# Patient Record
Sex: Male | Born: 1985 | Race: White | Hispanic: No | Marital: Single | Smoking: Never smoker
Health system: Southern US, Community
[De-identification: ages and names within clinical notes are randomized; demographics above are authoritative.]

## PROBLEM LIST (undated history)

## (undated) DIAGNOSIS — R109 Unspecified abdominal pain: Secondary | ICD-10-CM

## (undated) DIAGNOSIS — R0602 Shortness of breath: Secondary | ICD-10-CM

## (undated) HISTORY — PX: HERNIA REPAIR: SHX51

## (undated) HISTORY — DX: Shortness of breath: R06.02

## (undated) HISTORY — DX: Unspecified abdominal pain: R10.9

---

## 2007-07-25 ENCOUNTER — Emergency Department (HOSPITAL_COMMUNITY): Admission: EM | Admit: 2007-07-25 | Discharge: 2007-07-25 | Payer: Self-pay | Admitting: Emergency Medicine

## 2010-11-20 NOTE — Op Note (Signed)
NAMEKELLY, EISLER               ACCOUNT NO.:  [id number]   MEDICAL RECORD NO.:  [id number]          PATIENT TYPE:  EMS   LOCATION:  ED                            FACILITY:  [facility]   PHYSICIAN:  Tilford Pillar, MD      DATE OF BIRTH:  [birth year]   DATE OF PROCEDURE:  07/25/2007  DATE OF DISCHARGE:  07/25/2007                               OPERATIVE REPORT   PRE-PROCEDURE DIAGNOSIS:  Left lateral ventral loculation of thumb over  the proximal phalanx.   POST-PROCEDURE DIAGNOSIS:  Left lateral ventral loculation of thumb over  the proximal phalanx.   PROCEDURE:  Simple closure with interrupted #4-0 nylon sutures x5.   SURGEON:  Tilford Pillar, MD.   Local anesthetic of 1% Lidocaine plain.   ESTIMATED BLOOD LOSS:  Scant.   SPECIMENS:  None.   INDICATIONS:  The patient is a 25 year old male, who presented to the  emergency department after injuring his left thumb with a box cutter at  work.  There was significant bleeding at the time, although bleeding has  slowed with significant compression.  He has no decrease in sensation to  the distal thumb or around the surrounding areas.  He has no diminished  strength to the thumb, although has increased pain with adduction and  approximation of the thumb.  Due to the depth, which is through the  subcutaneous tissue down to tendon, I have recommended that we wash out  and primarily repair.  It has been less than an hour since the initial  event.  The procedure was described in detail and closure, and the  patient wishes to proceed.   PROCEDURE:  The patient was taken to an emergency room exam room.  The  hand was placed on the hospital tray and his wound was irrigated with  copious amounts of sterile saline.  A Betadine solution was then placed  around the wound, which was a linear laceration and no additional  debridement was required.  Simple interrupted #4-0 nylon sutures were  utilized to reapproximate the skin edges without any  difficulty.  A  local anesthetic was instilled prior to this with 1% plain Lidocaine.  The whole procedure was tolerated extremely well.  The skin was washed  following removal of the sutures.  A sterile gauze 2x2 dressing was  placed over the repair site and this was held in place with a  Kerlix roll.  He tolerated the procedure well and continued to have good  sensation following and had good movement and strength to the thumb.  No  pending injury was identified during the washout and repair of the  laceration.      Tilford Pillar, MD  Electronically Signed     BZ/MEDQ  D:  08/14/2007  T:  08/15/2007  Job:  [id number]

## 2014-04-20 ENCOUNTER — Encounter (HOSPITAL_COMMUNITY): Payer: Self-pay | Admitting: Emergency Medicine

## 2014-04-20 ENCOUNTER — Emergency Department (HOSPITAL_COMMUNITY)
Admission: EM | Admit: 2014-04-20 | Discharge: 2014-04-20 | Disposition: A | Discharge status: Home / Self Care | Payer: 59 | Attending: Emergency Medicine | Admitting: Emergency Medicine

## 2014-04-20 ENCOUNTER — Emergency Department (HOSPITAL_COMMUNITY): Payer: 59

## 2014-04-20 DIAGNOSIS — J069 Acute upper respiratory infection, unspecified: Secondary | ICD-10-CM | POA: Diagnosis not present

## 2014-04-20 DIAGNOSIS — R0602 Shortness of breath: Secondary | ICD-10-CM | POA: Diagnosis present

## 2014-04-20 DIAGNOSIS — J029 Acute pharyngitis, unspecified: Secondary | ICD-10-CM

## 2014-04-20 DIAGNOSIS — Z8659 Personal history of other mental and behavioral disorders: Secondary | ICD-10-CM | POA: Diagnosis not present

## 2014-04-20 LAB — RAPID STREP SCREEN (MED CTR MEBANE ONLY): STREPTOCOCCUS, GROUP A SCREEN (DIRECT): NEGATIVE

## 2014-04-20 NOTE — ED Provider Notes (Signed)
CSN: [id number]     Arrival date & time 04/20/14  0809 History   First MD Initiated Contact with Patient 04/20/14 [phone]     Chief Complaint  Patient presents with  . Shortness of Breath     (Consider location/radiation/quality/duration/timing/severity/associated sxs/prior Treatment) Patient is a 28 y.o. male presenting with shortness of breath. The history is provided by the patient.  Shortness of Breath Severity:  Mild Onset quality:  Gradual Duration:  5 days Timing:  Constant Progression:  Unchanged Chronicity:  New Context: URI   Relieved by:  Nothing Worsened by:  Nothing tried Associated symptoms: cough (mild, non-productive) and sore throat (mild)   Associated symptoms: no abdominal pain, no chest pain, no fever and no vomiting     History reviewed. No pertinent past medical history. History reviewed. No pertinent past surgical history. History reviewed. No pertinent family history. History  Substance Use Topics  . Smoking status: Never Smoker   . Smokeless tobacco: Not on file  . Alcohol Use: Not on file    Review of Systems  Constitutional: Negative for fever, chills and fatigue.  HENT: Positive for sore throat (mild). Negative for sneezing.   Respiratory: Positive for cough (mild, non-productive) and shortness of breath.   Cardiovascular: Negative for chest pain.  Gastrointestinal: Negative for nausea, vomiting and abdominal pain.  All other systems reviewed and are negative.     Allergies  Review of patient's allergies indicates no known allergies.  Home Medications   Prior to Admission medications   Not on File   BP 134/86  Pulse 82  Temp(Src) 98.4 F (36.9 C) (Oral)  Resp 20  Ht 5\' 8"  (1.727 m)  Wt 130 lb (58.968 kg)  BMI 19.77 kg/m2  SpO2 100% Physical Exam  Constitutional: He is oriented to person, place, and time. He appears well-developed and well-nourished. No distress.  HENT:  Head: Normocephalic and atraumatic.  Mouth/Throat:  Posterior oropharyngeal erythema (mild, on uvula and posterior pharnyx) present. No oropharyngeal exudate.  Eyes: EOM are normal. Pupils are equal, round, and reactive to light.  Neck: Normal range of motion. Neck supple. No tracheal deviation present. No thyromegaly present.  Cardiovascular: Normal rate and regular rhythm.  Exam reveals no friction rub.   No murmur heard. Pulmonary/Chest: Effort normal and breath sounds normal. No stridor. No respiratory distress. He has no wheezes. He has no rales. He exhibits no tenderness.  Abdominal: He exhibits no distension. There is no tenderness. There is no rebound.  Musculoskeletal: Normal range of motion. He exhibits no edema.  Lymphadenopathy:    He has no cervical adenopathy.  Neurological: He is alert and oriented to person, place, and time.  Skin: He is not diaphoretic.    ED Course  Procedures (including critical care time) Labs Review Labs Reviewed - No data to display  Imaging Review Dg Neck Soft Tissue  04/20/2014   CLINICAL DATA:  Cough and shortness of breath. Redness in the throat.  EXAM: NECK SOFT TISSUES - 1+ VIEW  COMPARISON:  None.  FINDINGS: The soft tissues of the neck are unremarkable. The airway is patent. The glottis is normal. Two views the cervical spine are unremarkable.  IMPRESSION: Negative soft tissue neck radiographs.   Electronically Signed   By: Gennette Pachris  Mattern M.D.   On: 04/20/2014 09:19   Dg Chest 2 View  04/20/2014   CLINICAL DATA:  Shortness breath and cough.  Redness in the throat.  EXAM: CHEST  2 VIEW  COMPARISON:  None.  FINDINGS: The heart size and mediastinal contours are within normal limits. Both lungs are clear. The visualized skeletal structures are unremarkable.  IMPRESSION: Negative two view chest.   Electronically Signed   By: Gennette Pachris  Mattern M.D.   On: 04/20/2014 09:21     EKG Interpretation   Date/Time:  Wednesday April 20 2014 08:19:00 EDT Ventricular Rate:  77 PR Interval:  152 QRS  Duration: 98 QT Interval:  366 QTC Calculation: 414 R Axis:   101 Text Interpretation:  Normal sinus rhythm with sinus arrhythmia RSR' or QR  pattern in V1 suggests right ventricular conduction delay Lateral infarct  , age undetermined No prior for comparison Confirmed by [facility]  MD, Arnetra Terris  ([id number]) on 04/20/2014 8:30:02 AM      MDM   Final diagnoses:  Sore throat  URI, acute    31M presents with SOB over past week. Seen twice, told it was a panic attack, then told it was a URI, given amoxicillin and an inhaler. Denies fevers, but states SOB constant over past week. Mild throat soreness, no pain with swallowing. Mild nasal congestion, no rhinorrhea. AFVSS here. HEENT exam shows some mild posterior pharyngeal erythema, no exudates. Neck supple, full ROM, normal thyroid exam. No fullness or lymphadenopathy.  Lungs clear, no wheezing or rhonchi.  CXR and Xray of soft tissue of neck normal. Strep negative. Counseled about this being a URI, given return precautions and need for PCP f/u if any complications arise.  Elwin MochaBlair Lovelace Cerveny, MD 04/20/14 1435

## 2014-04-20 NOTE — Discharge Instructions (Signed)

## 2014-04-20 NOTE — ED Notes (Signed)
Family at bedside. 

## 2014-04-20 NOTE — ED Notes (Signed)
Per pt sts SOB over the past week. sts the SOB is random and feels like he cant get a deep breath. Denies cough. sts some pressure in chest. Denies recent travels.

## 2014-04-22 LAB — CULTURE, GROUP A STREP

## 2015-05-18 IMAGING — CR DG NECK SOFT TISSUE
2 series · 2 of 2 positions shown · non-contrast
Comparison: None.

CLINICAL DATA: Cough and shortness of breath. Redness in the
throat.

EXAM:
NECK SOFT TISSUES - 1+ VIEW

[w soft tissue neck]
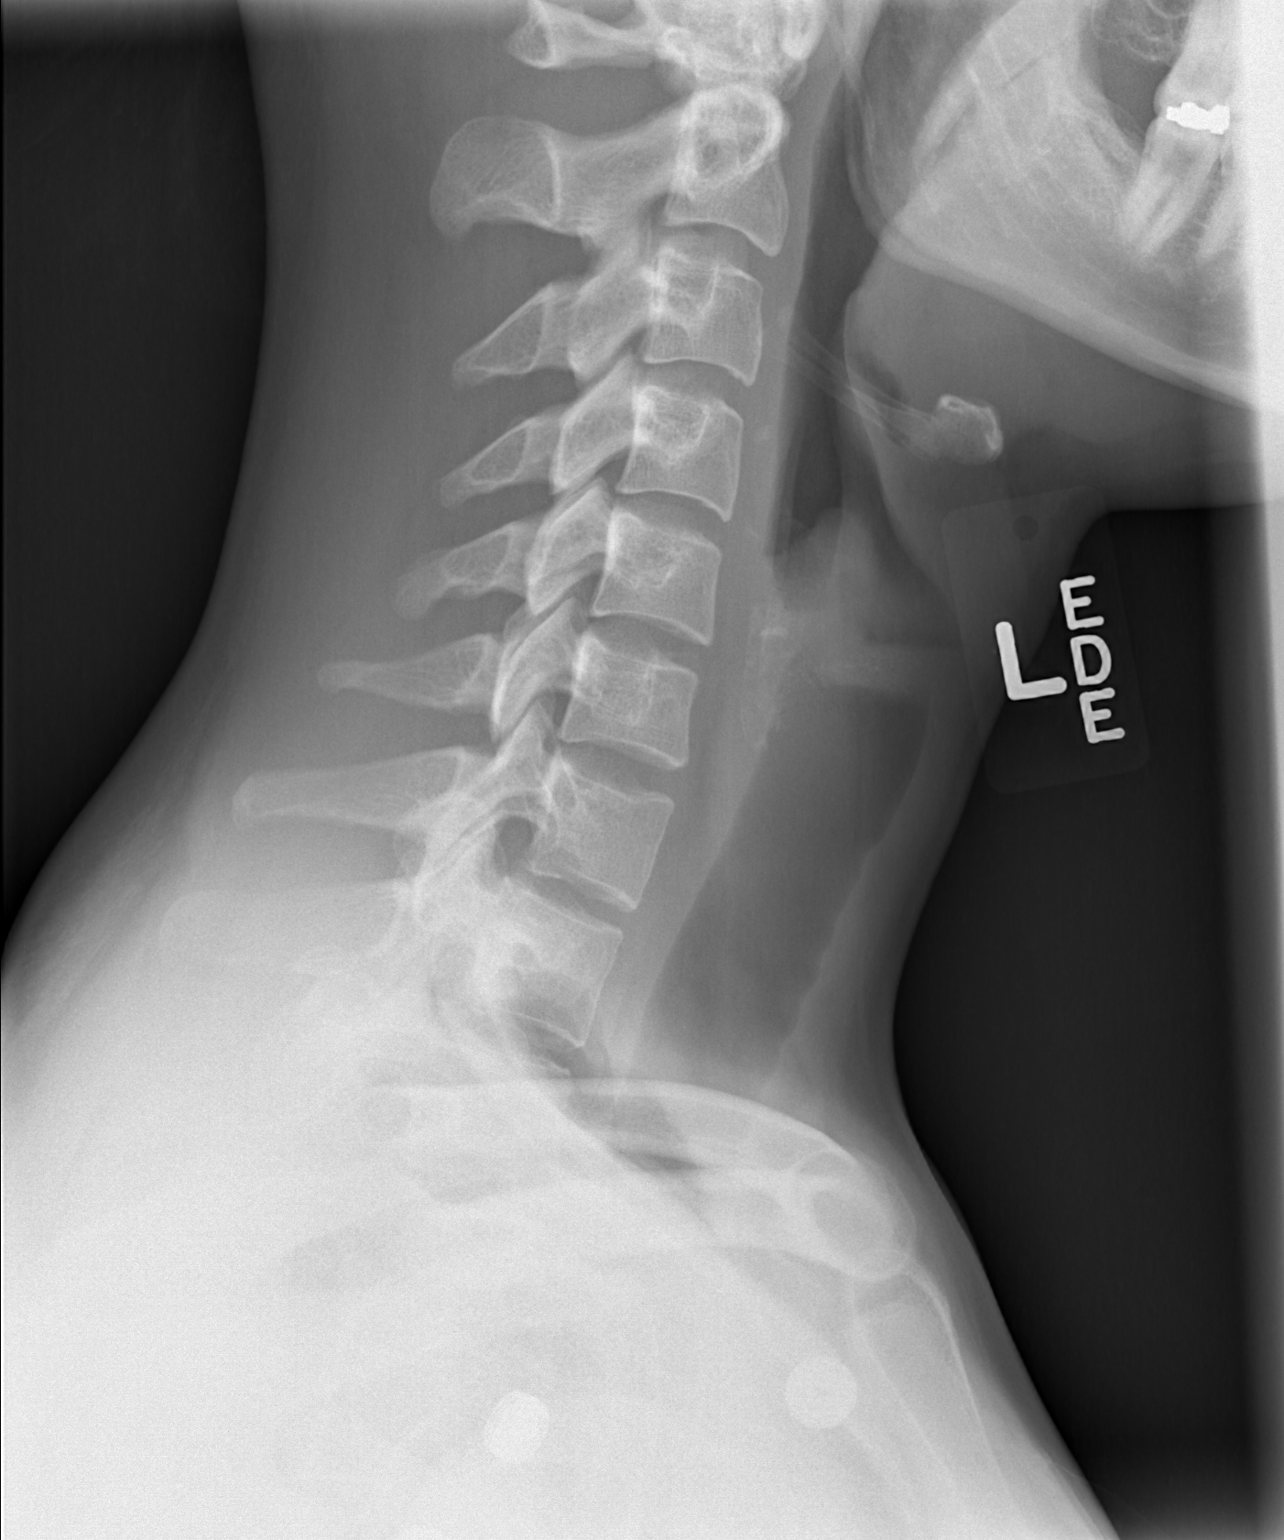

[w soft tissue neck ap]
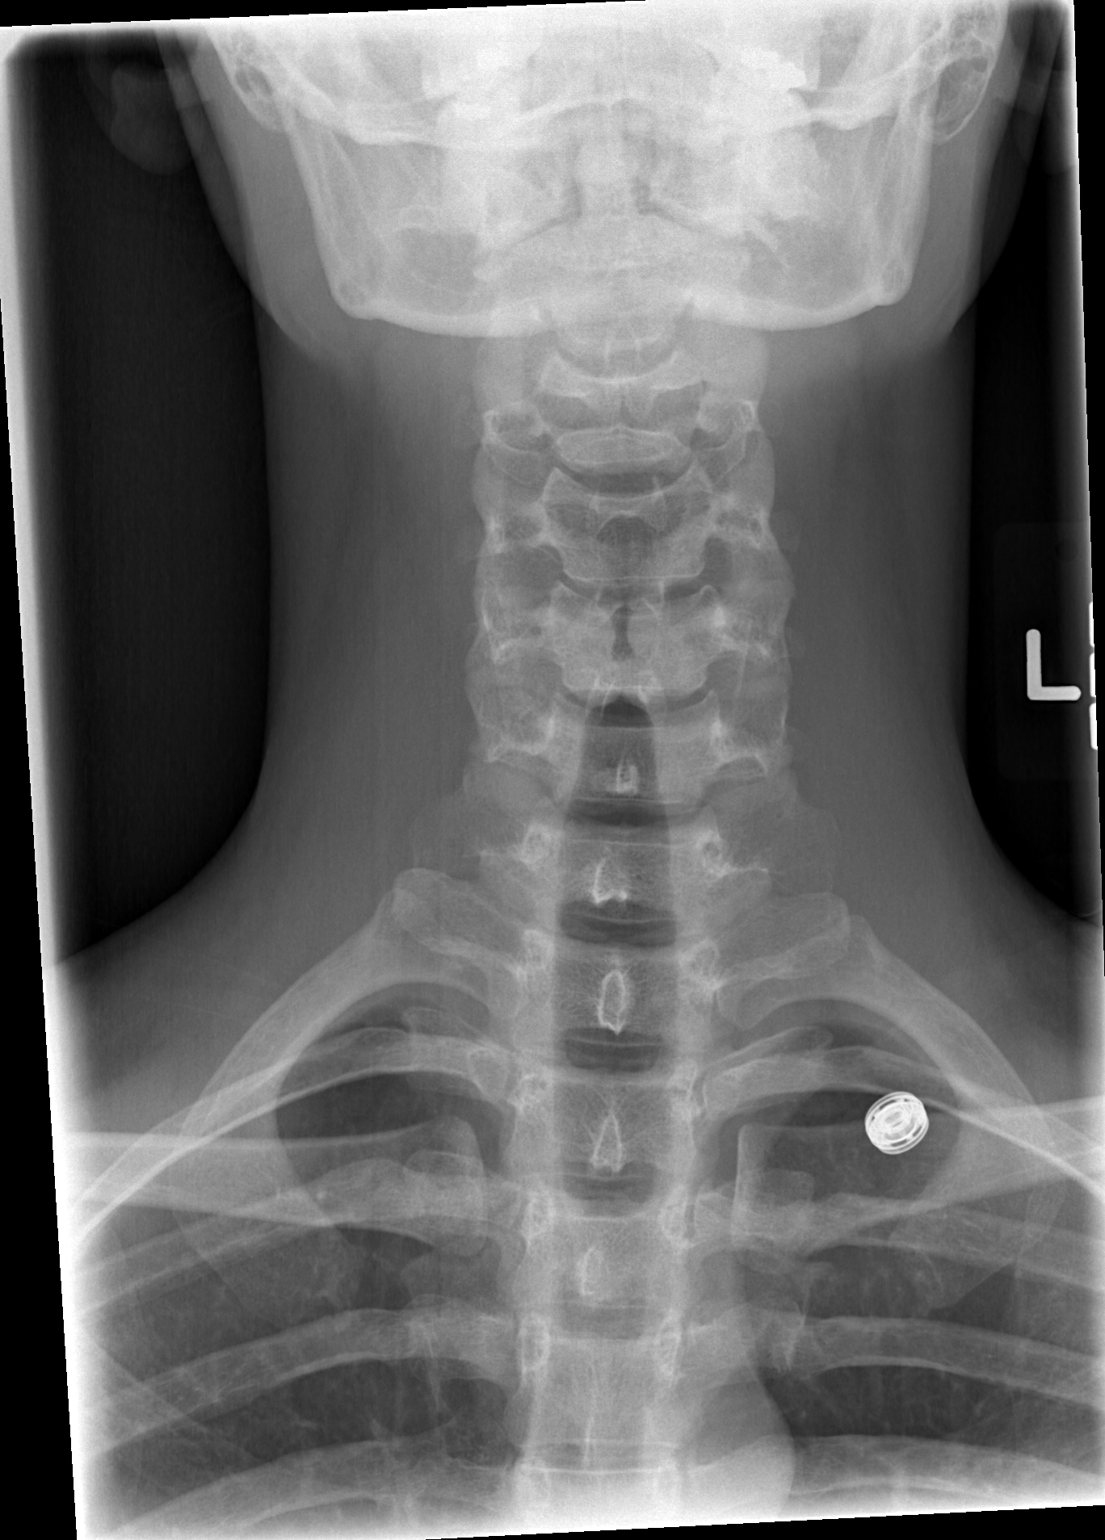

[2 of 2 positions shown; findings below may reference images not displayed]

FINDINGS: The soft tissues of the neck are unremarkable. The airway is patent.
The glottis is normal. Two views the cervical spine are
unremarkable.
IMPRESSION: Negative soft tissue neck radiographs.

## 2022-04-07 ENCOUNTER — Emergency Department (HOSPITAL_BASED_OUTPATIENT_CLINIC_OR_DEPARTMENT_OTHER): Payer: BC Managed Care – PPO | Admitting: Radiology

## 2022-04-07 ENCOUNTER — Emergency Department (HOSPITAL_BASED_OUTPATIENT_CLINIC_OR_DEPARTMENT_OTHER)
Admission: EM | Admit: 2022-04-07 | Discharge: 2022-04-07 | Disposition: A | Discharge status: Home / Self Care | Payer: BC Managed Care – PPO | Attending: Emergency Medicine | Admitting: Emergency Medicine

## 2022-04-07 ENCOUNTER — Other Ambulatory Visit: Payer: Self-pay

## 2022-04-07 ENCOUNTER — Encounter (HOSPITAL_BASED_OUTPATIENT_CLINIC_OR_DEPARTMENT_OTHER): Payer: Self-pay

## 2022-04-07 DIAGNOSIS — Y92410 Unspecified street and highway as the place of occurrence of the external cause: Secondary | ICD-10-CM | POA: Insufficient documentation

## 2022-04-07 DIAGNOSIS — S39012A Strain of muscle, fascia and tendon of lower back, initial encounter: Secondary | ICD-10-CM

## 2022-04-07 DIAGNOSIS — S3992XA Unspecified injury of lower back, initial encounter: Secondary | ICD-10-CM | POA: Diagnosis present
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
## 2022-04-07 NOTE — Discharge Instructions (Signed)
If you develop worsening, recurrent, or continued back pain, numbness or weakness in the legs, incontinence of your bowels or bladders, numbness of your buttocks, fever, abdominal pain, or any other new/concerning symptoms then return to the ER for evaluation.  

## 2022-05-01 ENCOUNTER — Ambulatory Visit
Admission: RE | Admit: 2022-05-01 | Discharge: 2022-05-01 | Disposition: A | Discharge status: Home / Self Care | Payer: BC Managed Care – PPO | Source: Ambulatory Visit | Attending: Orthopedic Surgery | Admitting: Orthopedic Surgery

## 2022-05-01 ENCOUNTER — Other Ambulatory Visit: Payer: Self-pay | Admitting: Orthopedic Surgery

## 2022-05-01 DIAGNOSIS — M5451 Vertebrogenic low back pain: Secondary | ICD-10-CM

## 2022-06-16 ENCOUNTER — Emergency Department (HOSPITAL_COMMUNITY)
Admission: EM | Admit: 2022-06-16 | Discharge: 2022-06-17 | Disposition: A | Discharge status: Home / Self Care | Payer: BC Managed Care – PPO | Attending: Emergency Medicine | Admitting: Emergency Medicine

## 2022-06-16 ENCOUNTER — Emergency Department (HOSPITAL_COMMUNITY): Payer: BC Managed Care – PPO

## 2022-06-16 DIAGNOSIS — R1030 Lower abdominal pain, unspecified: Secondary | ICD-10-CM | POA: Insufficient documentation

## 2022-06-16 DIAGNOSIS — R102 Pelvic and perineal pain: Secondary | ICD-10-CM | POA: Diagnosis present

## 2022-06-16 LAB — URINALYSIS, ROUTINE W REFLEX MICROSCOPIC
Bilirubin Urine: NEGATIVE
Glucose, UA: NEGATIVE mg/dL
Hgb urine dipstick: NEGATIVE
Ketones, ur: NEGATIVE mg/dL
Leukocytes,Ua: NEGATIVE
Nitrite: NEGATIVE
Protein, ur: NEGATIVE mg/dL
Specific Gravity, Urine: 1.019 (ref 1.005–1.030)
pH: 6 (ref 5.0–8.0)

## 2022-06-16 LAB — CBC WITH DIFFERENTIAL/PLATELET
Abs Immature Granulocytes: 0.03 K/uL (ref 0.00–0.07)
Basophils Absolute: 0 K/uL (ref 0.0–0.1)
Basophils Relative: 0 %
Eosinophils Absolute: 0.2 K/uL (ref 0.0–0.5)
Eosinophils Relative: 3 %
HCT: 48.4 % (ref 39.0–52.0)
Hemoglobin: 16.3 g/dL (ref 13.0–17.0)
Immature Granulocytes: 1 %
Lymphocytes Relative: 30 %
Lymphs Abs: 1.7 K/uL (ref 0.7–4.0)
MCH: 30.5 pg (ref 26.0–34.0)
MCHC: 33.7 g/dL (ref 30.0–36.0)
MCV: 90.6 fL (ref 80.0–100.0)
Monocytes Absolute: 0.4 K/uL (ref 0.1–1.0)
Monocytes Relative: 6 %
Neutro Abs: 3.4 K/uL (ref 1.7–7.7)
Neutrophils Relative %: 60 %
Platelets: 215 K/uL (ref 150–400)
RBC: 5.34 MIL/uL (ref 4.22–5.81)
RDW: 12.3 % (ref 11.5–15.5)
WBC: 5.6 K/uL (ref 4.0–10.5)
nRBC: 0 % (ref 0.0–0.2)

## 2022-06-16 LAB — COMPREHENSIVE METABOLIC PANEL
ALT: 24 U/L (ref 0–44)
AST: 22 U/L (ref 15–41)
Albumin: 4.7 g/dL (ref 3.5–5.0)
Alkaline Phosphatase: 41 U/L (ref 38–126)
Anion gap: 9 (ref 5–15)
BUN: 10 mg/dL (ref 6–20)
CO2: 24 mmol/L (ref 22–32)
Calcium: 9.4 mg/dL (ref 8.9–10.3)
Chloride: 108 mmol/L (ref 98–111)
Creatinine, Ser: 1.06 mg/dL (ref 0.61–1.24)
GFR, Estimated: >60 mL/min (ref >60)
Glucose, Bld: 93 mg/dL (ref 70–99)
Potassium: 4.1 mmol/L (ref 3.5–5.1)
Sodium: 141 mmol/L (ref 135–145)
Total Bilirubin: 0.8 mg/dL (ref 0.3–1.2)
Total Protein: 7.8 g/dL (ref 6.5–8.1)

## 2022-06-16 LAB — LIPASE, BLOOD: Lipase: 49 U/L (ref 11–51)

## 2022-06-16 MED ORDER — METHOCARBAMOL 500 MG PO TABS
500.0000 mg | ORAL_TABLET | Freq: Once | ORAL | Status: AC
  Administered 2022-06-16: 500 mg via ORAL
  Filled 2022-06-16: qty 1

## 2022-06-16 MED ORDER — KETOROLAC TROMETHAMINE 60 MG/2ML IM SOLN
60.0000 mg | Freq: Once | INTRAMUSCULAR | Status: AC
  Administered 2022-06-16: 60 mg via INTRAMUSCULAR
  Filled 2022-06-16: qty 2
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
## 2022-06-17 MED ORDER — NAPROXEN 500 MG PO TABS: 500.0000 mg | ORAL_TABLET | Freq: Two times a day (BID) | ORAL | 0 refills | Status: DC | PRN

## 2022-06-17 MED ORDER — METHOCARBAMOL 500 MG PO TABS: 500.0000 mg | ORAL_TABLET | Freq: Two times a day (BID) | ORAL | 0 refills | Status: DC | PRN
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
## 2022-12-02 ENCOUNTER — Ambulatory Visit
Admission: EM | Admit: 2022-12-02 | Discharge: 2022-12-02 | Disposition: A | Discharge status: Home / Self Care | Payer: BC Managed Care – PPO | Attending: Nurse Practitioner | Admitting: Nurse Practitioner

## 2022-12-02 DIAGNOSIS — H60502 Unspecified acute noninfective otitis externa, left ear: Secondary | ICD-10-CM

## 2022-12-02 MED ORDER — OFLOXACIN 0.3 % OT SOLN: 10.0000 [drp] | Freq: Every day | OTIC | 0 refills | Status: AC

## 2022-12-02 NOTE — ED Triage Notes (Signed)
Pt reports he has some bilateral ear pain, ringing, swollen feeling x 6 days. He went to another UC and received flonase, prednisone, and xzyal but no relief. Pt reports he has been getting dizzy as well.

## 2022-12-02 NOTE — ED Provider Notes (Signed)
[facility]    CSN: [id number] Arrival date & time: 12/02/22  1326      History   Chief Complaint No chief complaint on file.   HPI Hunter Barnes is a 37 y.o. male.   Patient presents today with bilateral ear pain worse in the left.  Reports it started a few days ago and started with ringing in the ears.  He describes the pain as a pressure and aching.  No fever, ear drainage, significant sore throat or cough.  Reports he did have a little bit of congestion in his face that is now better.  Reports the tinnitus is still present, worse in the left ear.  No ear itching or recent water immersion.  Reports decreased hearing out of the left ear.  Reports he does use Q-tips on a regular basis, but does not insert into his ear and just cleans the outside of his ear.  Has been taking Xyzal, Flonase, and prednisone without much improvement in symptoms.  Patient reports his father has history of similar and is requesting amoxicillin and ears to be flushed today.    History reviewed. No pertinent past medical history.  There are no problems to display for this patient.   History reviewed. No pertinent surgical history.     Home Medications    Prior to Admission medications   Medication Sig Start Date End Date Taking? Authorizing Provider  ofloxacin (FLOXIN) 0.3 % OTIC solution Place 10 drops into the left ear daily for 7 days. 12/02/22 12/09/22 Yes Valentino Nose, NP  albuterol (PROVENTIL HFA;VENTOLIN HFA) 108 (90 BASE) MCG/ACT inhaler Inhale 2 puffs into the lungs every 6 (six) hours as needed for wheezing or shortness of breath.    [provider]    Family History History reviewed. No pertinent family history.  Social History Social History   Tobacco Use   Smoking status: Never  Substance Use Topics   Alcohol use: Not Currently   Drug use: Not Currently     Allergies   Patient has no known allergies.   Review of Systems Review of  Systems Per HPI  Physical Exam Triage Vital Signs ED Triage Vitals [12/02/22 1433]  Enc Vitals Group     BP (!) 149/79     Pulse Rate 78     Resp 16     Temp 98.1 F (36.7 C)     Temp Source Oral     SpO2 98 %     Weight      Height      Head Circumference      Peak Flow      Pain Score 6     Pain Loc      Pain Edu?      Excl. in GC?    No data found.  Updated Vital Signs BP (!) 149/79 (BP Location: Right Arm)   Pulse 78   Temp 98.1 F (36.7 C) (Oral)   Resp 16   SpO2 98%   Visual Acuity Right Eye Distance:   Left Eye Distance:   Bilateral Distance:    Right Eye Near:   Left Eye Near:    Bilateral Near:     Physical Exam Vitals and nursing note reviewed.  Constitutional:      General: He is not in acute distress.    Appearance: Normal appearance. He is not toxic-appearing.  HENT:     Head: Normocephalic and atraumatic.     Right Ear: Tympanic  membrane, ear canal and external ear normal. There is no impacted cerumen.     Left Ear: Tympanic membrane, ear canal and external ear normal. Swelling and tenderness present. There is no impacted cerumen.     Nose: Nose normal. No congestion or rhinorrhea.     Mouth/Throat:     Mouth: Mucous membranes are moist.     Pharynx: Oropharynx is clear. No oropharyngeal exudate or posterior oropharyngeal erythema.  Eyes:     General: No scleral icterus.    Extraocular Movements: Extraocular movements intact.  Cardiovascular:     Rate and Rhythm: Normal rate and regular rhythm.  Pulmonary:     Effort: Pulmonary effort is normal. No respiratory distress.  Musculoskeletal:     Cervical back: Normal range of motion.  Lymphadenopathy:     Cervical: No cervical adenopathy.  Skin:    General: Skin is warm and dry.     Capillary Refill: Capillary refill takes less than 2 seconds.     Coloration: Skin is not jaundiced or pale.     Findings: No erythema.  Neurological:     Mental Status: He is alert and oriented to person,  place, and time.  Psychiatric:        Behavior: Behavior is cooperative.      UC Treatments / Results  Labs (all labs ordered are listed, but only abnormal results are displayed) Labs Reviewed - No data to display  EKG   Radiology No results found.  Procedures Ear Cerumen Removal  Date/Time: 12/02/2022 6:08 PM  Performed by: Valentino Nose, NP Authorized by: Valentino Nose, NP   Consent:    Consent obtained:  Verbal   Consent given by:  Patient   Risks, benefits, and alternatives were discussed: yes     Risks discussed:  Bleeding, infection, incomplete removal, TM perforation, pain and dizziness   Alternatives discussed:  Alternative treatment and observation Universal protocol:    Procedure explained and questions answered to patient or proxy's satisfaction: yes     Patient identity confirmed:  Verbally with patient Procedure details:    Location:  R ear   Procedure type: irrigation   Post-procedure details:    Inspection:  No bleeding and ear canal clear   Post-procedure hearing quality: no difference.   Procedure completion:  Tolerated  (including critical care time)  Medications Ordered in UC Medications - No data to display  Initial Impression / Assessment and Plan / UC Course  I have reviewed the triage vital signs and the nursing notes.  Pertinent labs & imaging results that were available during my care of the patient were reviewed by me and considered in my medical decision making (see chart for details).   Patient is well-appearing, normotensive, afebrile, not tachycardic, not tachypneic, oxygenating well on room air.    1. Acute otitis externa of left ear, unspecified type Patient adamantly requesting ears be flushed today Ear lavage provided to right ear without improvement in symptoms; patient declines ear lavage for left ear thereafter Given mild swelling to external auditory canal of the left ear, will treat for otitis externa with  ofloxacin drops daily for 7 days Ear precautions and return precautions discussed with patient Follow-up with ENT with no improvement or worsening of symptoms despite treatment  The patient was given the opportunity to ask questions.  All questions answered to their satisfaction.  The patient is in agreement to this plan.    Final Clinical Impressions(s) / UC Diagnoses   Final  diagnoses:  Acute otitis externa of left ear, unspecified type     Discharge Instructions      Use the ear drops to the left ear to help clear up any outer ear infection  Use good ear precautions and recommended stopping use of Q tips  Follow up with ENT with no improvement in symptoms despite treatment     ED Prescriptions     Medication Sig Dispense Auth. Provider   ofloxacin (FLOXIN) 0.3 % OTIC solution Place 10 drops into the left ear daily for 7 days. 5 mL Valentino Nose, NP      PDMP not reviewed this encounter.   Valentino Nose, NP 12/02/22 1810

## 2022-12-02 NOTE — Discharge Instructions (Signed)
Use the ear drops to the left ear to help clear up any outer ear infection  Use good ear precautions and recommended stopping use of Q tips  Follow up with ENT with no improvement in symptoms despite treatment

## 2024-03-02 ENCOUNTER — Telehealth: Payer: Self-pay

## 2024-03-02 NOTE — Telephone Encounter (Signed)
 PT has resched as directed.

## 2024-03-02 NOTE — Telephone Encounter (Signed)
 Pt rescheduled to 9/4 with Dr. Catherine I have scheduled this appt.  Nothing further needed.

## 2024-03-02 NOTE — Telephone Encounter (Signed)
 ATC the patient, no answer- left vm to call back.  ATC pts moms number in chart, no answer- lvm to call back.   Pt is scheduled 8/27 with Dr. Shelah (self-referral for pulm issues)  I am trying to change/reschedule pts appt to see Dr. Catherine (Verified with Dr. Catherine through secure chat OK to use blocked spot)  Routing to front staff to trying change pts appt.   Dr. Catherine has blocked spots in [place] 9/3 & 9/4 and some blocked spots at [facility]. Please try to change pts appt as Dr. Shelah does not usually take self-referrals.  Thank you!!  Front staff please document in this encounter.

## 2024-03-03 ENCOUNTER — Ambulatory Visit: Payer: Self-pay | Admitting: Emergency Medicine

## 2024-03-10 NOTE — Progress Notes (Unsigned)
   New Patient Pulmonology Office Visit   Subjective:  Patient ID: Hunter Barnes, male    DOB: 01-10-1986  MRN: [id number]  Referred by: Verdia Lombard, MD  CC: No chief complaint on file.   HPI Hunter Barnes is a 38 y.o. male who presents for initial consultation in the setting of dyspnea.    {PULM QUESTIONNAIRES (Optional):33196}  ROS  Allergies: Patient has no known allergies.  Current Outpatient Medications:    albuterol  (PROVENTIL  HFA;VENTOLIN  HFA) 108 (90 BASE) MCG/ACT inhaler, Inhale 2 puffs into the lungs every 6 (six) hours as needed for wheezing or shortness of breath., Disp: , Rfl:  No past medical history on file. No past surgical history on file. No family history on file. Social History   Socioeconomic History   Marital status: Single    Spouse name: Not on file   Number of children: Not on file   Years of education: Not on file   Highest education level: Not on file  Occupational History   Not on file  Tobacco Use   Smoking status: Never   Smokeless tobacco: Not on file  Substance and Sexual Activity   Alcohol use: Not Currently   Drug use: Not Currently   Sexual activity: Not on file  Other Topics Concern   Not on file  Social History Narrative   Not on file   Social Drivers of Health   Financial Resource Strain: Not on file  Food Insecurity: No Food Insecurity (02/02/2024)   Received from [facility]   Hunger Vital Sign    Within the past 12 months, you worried that your food would run out before you got the money to buy more.: Never true    Within the past 12 months, the food you bought just didn't last and you didn't have money to get more.: Never true  Transportation Needs: No Transportation Needs (02/02/2024)   Received from [facility]   PRAPARE - Transportation    Lack of Transportation (Medical): No    Lack of Transportation (Non-Medical): No  Physical Activity: Not on file  Stress: Not on file  Social Connections:  Not on file  Intimate Partner Violence: Not on file       Objective:  There were no vitals taken for this visit. {Pulm Vitals (Optional):32837}  Physical Exam  Diagnostic Review:  {Labs (Optional):32838}  CXR 04/2014: normal    Assessment & Plan:   Assessment & Plan   No orders of the defined types were placed in this encounter.     No follow-ups on file.   Rodriques Badie, MD

## 2024-03-11 ENCOUNTER — Ambulatory Visit (HOSPITAL_COMMUNITY)
Admission: RE | Admit: 2024-03-11 | Discharge: 2024-03-11 | Disposition: A | Discharge status: Home / Self Care | Source: Ambulatory Visit | Attending: Pulmonary Disease | Admitting: Pulmonary Disease

## 2024-03-11 ENCOUNTER — Ambulatory Visit: Admitting: Pulmonary Disease

## 2024-03-11 ENCOUNTER — Telehealth: Payer: Self-pay | Admitting: Pulmonary Disease

## 2024-03-11 ENCOUNTER — Encounter: Payer: Self-pay | Admitting: Pulmonary Disease

## 2024-03-11 VITALS — BP 135/92 | HR 75 | Ht 68.0 in | Wt 143.4 lb

## 2024-03-11 DIAGNOSIS — R0609 Other forms of dyspnea: Secondary | ICD-10-CM | POA: Insufficient documentation

## 2024-03-11 MED ORDER — ALBUTEROL SULFATE HFA 108 (90 BASE) MCG/ACT IN AERS: 2.0000 | INHALATION_SPRAY | Freq: Four times a day (QID) | RESPIRATORY_TRACT | 6 refills | Status: AC | PRN

## 2024-03-11 NOTE — Patient Instructions (Signed)
-   Get CXR, breathing test, blood test - Start albuterol  inhaler every 4-6 hours as needed - Keep a diary of symptoms and surrounding - Bring you back in 2-3 months to see how you are doing

## 2024-03-11 NOTE — Telephone Encounter (Signed)
 Spoke with patient regarding the Tuesday 05/25/24 10:00 am PFT appointment at [facility] time is  9:45 am--1st floor registration desk for check in--will mail informatin to patient and he voiced his understanding

## 2024-03-12 ENCOUNTER — Ambulatory Visit: Payer: Self-pay | Admitting: Pulmonary Disease

## 2024-03-12 LAB — CBC WITH DIFFERENTIAL/PLATELET
Basophils Absolute: 0 x10E3/uL (ref 0.0–0.2)
Basos: 1 %
EOS (ABSOLUTE): 0.1 x10E3/uL (ref 0.0–0.4)
Eos: 2 %
Hematocrit: 51 % (ref 37.5–51.0)
Hemoglobin: 16.8 g/dL (ref 13.0–17.7)
Immature Grans (Abs): 0 x10E3/uL (ref 0.0–0.1)
Immature Granulocytes: 0 %
Lymphocytes Absolute: 1.5 x10E3/uL (ref 0.7–3.1)
Lymphs: 25 %
MCH: 30.9 pg (ref 26.6–33.0)
MCHC: 32.9 g/dL (ref 31.5–35.7)
MCV: 94 fL (ref 79–97)
Monocytes Absolute: 0.4 x10E3/uL (ref 0.1–0.9)
Monocytes: 7 %
Neutrophils Absolute: 4 x10E3/uL (ref 1.4–7.0)
Neutrophils: 65 %
Platelets: 224 x10E3/uL (ref 150–450)
RBC: 5.44 x10E6/uL (ref 4.14–5.80)
RDW: 13 % (ref 11.6–15.4)
WBC: 6.1 x10E3/uL (ref 3.4–10.8)

## 2024-03-12 LAB — D-DIMER, QUANTITATIVE: D-DIMER: <0.2 mg{FEU}/L (ref 0.00–0.49)

## 2024-05-25 ENCOUNTER — Ambulatory Visit (HOSPITAL_COMMUNITY)
Admission: RE | Admit: 2024-05-25 | Discharge: 2024-05-25 | Disposition: A | Discharge status: Home / Self Care | Source: Ambulatory Visit | Attending: Pulmonary Disease | Admitting: Pulmonary Disease

## 2024-05-25 DIAGNOSIS — R0609 Other forms of dyspnea: Secondary | ICD-10-CM | POA: Insufficient documentation

## 2024-05-25 LAB — PULMONARY FUNCTION TEST
DL/VA % pred: 94 %
DL/VA: 4.57 ml/min/mmHg/L
DLCO unc % pred: 76 %
DLCO unc: 23.8 ml/min/mmHg
FEF 25-75 Post: 3.89 L/s
FEF 25-75 Pre: 2.9 L/s
FEF2575-%Change-Post: 34 %
FEF2575-%Pred-Post: 94 %
FEF2575-%Pred-Pre: 70 %
FEV1-%Change-Post: 9 %
FEV1-%Pred-Post: 84 %
FEV1-%Pred-Pre: 77 %
FEV1-Post: 3.55 L
FEV1-Pre: 3.25 L
FEV1FVC-%Change-Post: 4 %
FEV1FVC-%Pred-Pre: 96 %
FEV6-%Change-Post: 4 %
FEV6-%Pred-Post: 84 %
FEV6-%Pred-Pre: 80 %
FEV6-Post: 4.35 L
FEV6-Pre: 4.14 L
FEV6FVC-%Change-Post: 0 %
FEV6FVC-%Pred-Post: 101 %
FEV6FVC-%Pred-Pre: 101 %
FVC-%Change-Post: 4 %
FVC-%Pred-Post: 83 %
FVC-%Pred-Pre: 79 %
FVC-Post: 4.37 L
FVC-Pre: 4.17 L
Post FEV1/FVC ratio: 81 %
Post FEV6/FVC ratio: 99 %
Pre FEV1/FVC ratio: 78 %
Pre FEV6/FVC Ratio: 99 %
RV % pred: 72 %
RV: 1.21 L
TLC % pred: 85 %
TLC: 5.72 L

## 2024-05-25 MED ORDER — ALBUTEROL SULFATE (2.5 MG/3ML) 0.083% IN NEBU
2.5000 mg | INHALATION_SOLUTION | Freq: Once | RESPIRATORY_TRACT | Status: AC
  Administered 2024-05-25: 2.5 mg via RESPIRATORY_TRACT

## 2024-06-01 ENCOUNTER — Encounter: Payer: Self-pay | Admitting: Nurse Practitioner

## 2024-06-27 NOTE — Progress Notes (Unsigned)
" ° °  Established Patient Pulmonology Office Visit   Subjective:  Patient ID: Hunter Barnes, male    DOB: [birth year]  MRN: [id number]  CC: No chief complaint on file.   HPI  Hunter Barnes is a 38 y/o M who presents for follow up.  Last seen 03/2024 for dyspnea. Ordered PFTs with bronchodilators and MIPs/MEPs, CBCD, D dimer, and CXR.   {PULM QUESTIONNAIRES (Optional):33196}  ROS  {History (Optional):23778} Current Medications[1]      Objective:  There were no vitals taken for this visit. {Pulm Vitals (Optional):32837}  Physical Exam   Diagnostic Review:  {Labs (Optional):32838}  PFTs relatively normal, + bronchodilator response CXR normal     Assessment & Plan:   Assessment & Plan Dyspnea on exertion   No orders of the defined types were placed in this encounter.     No follow-ups on file.   Adewale Pucillo, MD    [1]  Current Outpatient Medications:    albuterol  (PROVENTIL  HFA;VENTOLIN  HFA) 108 (90 BASE) MCG/ACT inhaler, Inhale 2 puffs into the lungs every 6 (six) hours as needed for wheezing or shortness of breath. (Patient not taking: Reported on 03/11/2024), Disp: , Rfl:    albuterol  (VENTOLIN  HFA) 108 (90 Base) MCG/ACT inhaler, Inhale 2 puffs into the lungs every 6 (six) hours as needed for wheezing or shortness of breath., Disp: 8 g, Rfl: 6   Azelastine HCl 137 MCG/SPRAY SOLN, 1 spray., Disp: , Rfl:    fexofenadine (ALLEGRA) 180 MG tablet, Take 180 mg by mouth daily., Disp: , Rfl:   "

## 2024-06-28 ENCOUNTER — Encounter: Payer: Self-pay | Admitting: Pulmonary Disease

## 2024-06-28 ENCOUNTER — Ambulatory Visit: Admitting: Pulmonary Disease

## 2024-06-28 VITALS — BP 127/79 | HR 76 | Ht 68.0 in | Wt 144.0 lb

## 2024-06-28 DIAGNOSIS — J4530 Mild persistent asthma, uncomplicated: Secondary | ICD-10-CM | POA: Diagnosis not present

## 2024-06-28 DIAGNOSIS — R0609 Other forms of dyspnea: Secondary | ICD-10-CM | POA: Diagnosis not present

## 2024-06-28 MED ORDER — ALBUTEROL SULFATE (2.5 MG/3ML) 0.083% IN NEBU: 2.5000 mg | INHALATION_SOLUTION | Freq: Four times a day (QID) | RESPIRATORY_TRACT | 5 refills | Status: AC | PRN

## 2024-06-28 NOTE — Patient Instructions (Signed)
" °  VISIT SUMMARY: Today, we discussed your mild persistent asthma and the shortness of breath you have been experiencing. We reviewed your current treatment and made some adjustments to help manage your symptoms better.  YOUR PLAN: MILD PERSISTENT ASTHMA: You have mild persistent asthma with intermittent shortness of breath, cough, and wheezing. Your symptoms are somewhat controlled with your current inhaler, Airsupra. -Continue using the Airsupra inhaler as needed, 2 puffs every 4-6 hours. -We have ordered a nebulizer and albuterol  solution for you to use at home during severe episodes. -We have also ordered an incentive spirometer to help strengthen your diaphragm muscles. -Use the incentive spirometer 5-10 times every 1-2 hours while you are awake. -Monitor how often you use the Airsupra inhaler. If you need it more than 3-4 times a week, we may need to consider a maintenance inhaler. -Gradually increase your exercise, including weight training, to help strengthen your breathing muscles.  DYSPNEA ON EXERTION: You experience shortness of breath during physical activities, possibly due to diaphragm muscle weakness and a previous back injury. -Use the incentive spirometer 5-10 times every 1-2 hours while you are awake to strengthen your diaphragm muscles. -Gradually increase your exercise, including weight training, to help strengthen your breathing muscles. -Consider getting a pulse oximeter to monitor your oxygen levels during episodes when you feel short of breath or notice cyanosis (blue lips).  Contains text generated by [employer].  "

## 2024-07-20 ENCOUNTER — Ambulatory Visit: Admitting: Nurse Practitioner

## 2024-07-20 ENCOUNTER — Encounter: Payer: Self-pay | Admitting: Nurse Practitioner

## 2024-07-20 VITALS — BP 119/78 | HR 67 | Ht 68.0 in | Wt 141.0 lb

## 2024-07-20 DIAGNOSIS — R1012 Left upper quadrant pain: Secondary | ICD-10-CM

## 2024-07-20 DIAGNOSIS — R10814 Left lower quadrant abdominal tenderness: Secondary | ICD-10-CM | POA: Diagnosis not present

## 2024-07-20 DIAGNOSIS — R0781 Pleurodynia: Secondary | ICD-10-CM | POA: Diagnosis not present

## 2024-07-20 DIAGNOSIS — R1013 Epigastric pain: Secondary | ICD-10-CM

## 2024-07-20 DIAGNOSIS — R0602 Shortness of breath: Secondary | ICD-10-CM

## 2024-07-20 DIAGNOSIS — R1010 Upper abdominal pain, unspecified: Secondary | ICD-10-CM
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
## 2024-07-20 NOTE — Patient Instructions (Addendum)
 _______________________________________________________  If your blood pressure at your visit was 140/90 or greater, please contact your primary care physician to follow up on this.  _______________________________________________________  If you are age 37 or older, your body mass index should be between 23-30. Your Body mass index is 21.44 kg/m. If this is out of the aforementioned range listed, please consider follow up with your Primary Care Provider.  If you are age 7 or younger, your body mass index should be between 19-25. Your Body mass index is 21.44 kg/m. If this is out of the aformentioned range listed, please consider follow up with your Primary Care Provider.   ________________________________________________________  The [facility] providers would like to encourage you to use MYCHART to communicate with providers for non-urgent requests or questions.  Due to long hold times on the telephone, sending your provider a message by [facility] may be a faster and more efficient way to get a response.  Please allow 48 business hours for a response.  Please remember that this is for non-urgent requests.  _______________________________________________________  [facility] is using a team-based approach to care.  Your team is made up of your doctor and two to three APPS. Our APPS (Nurse Practitioners and Physician Assistants) work with your physician to ensure care continuity for you. They are fully qualified to address your health concerns and develop a treatment plan. They communicate directly with your gastroenterologist to care for you. Seeing the Advanced Practice Practitioners on your physician's team can help you by facilitating care more promptly, often allowing for earlier appointments, access to diagnostic testing, procedures, and other specialty referrals.   Your provider has ordered [employer] stool testing for you. You have received a kit from our office today  containing all necessary supplies to complete this test. Please carefully read the stool collection instructions provided in the kit before opening the accompanying materials. In addition, be sure there is a label providing your full name and date of birth on the puritan opti-swab tube that is supplied in the kit (if you do not see a label with this information on your test tube, please make us  aware before test collection!). After completing the test, you should secure the purtian tube into the specimen biohazard bag. The [facility] E-Req sheet (including date and time of specimen collection) should be placed into the outside pocket of the specimen biohazard bag and returned to the [place] lab (basement floor of [facility]) within 2 days of collection. Please make sure to give the specimen to a staff member at the lab. DO NOT leave the specimen on the counter.   If the specimen date and time (can be found in the upper right boxed portion of the sheet) are not filled out on the E-Req sheet, the test will NOT be performed.    You have been scheduled for a CT scan of the chest abdomen and pelvis at [facility] [place], [place], [place] [address]).   You are scheduled on 07-27-24 at 530pm. You should arrive by 330pm your appointment time for registration. Please follow the written instructions below on the day of your exam:  WARNING: IF YOU ARE ALLERGIC TO IODINE/X-RAY DYE, PLEASE NOTIFY RADIOLOGY IMMEDIATELY AT [phone]! YOU WILL BE GIVEN A 13 HOUR PREMEDICATION PREP.  1) Do not eat anything after 330pm (2 hours prior to your test) 2) You will be given 2 bottles of oral contrast to drink on site.    Drink 1  bottle of contrast @ 330pm (2 hours prior to your exam)  Drink 1 bottle of contrast @  430pm (1 hour prior to your exam)  You may take any medications as prescribed with a small amount of water, if necessary. If you take any of the following  medications: METFORMIN, GLUCOPHAGE, GLUCOVANCE, AVANDAMET, RIOMET, FORTAMET, ACTOPLUS MET, JANUMET, GLUMETZA or METAGLIP, you MAY be asked to HOLD this medication 48 hours AFTER the exam.  The purpose of you drinking the oral contrast is to aid in the visualization of your intestinal tract. The contrast solution may cause some diarrhea. Depending on your individual set of symptoms, you may also receive an intravenous injection of x-ray contrast/dye. Plan on being at [facility] for 30 minutes or longer, depending on the type of exam you are having performed.  This test typically takes 30-45 minutes to complete.  If you have any questions regarding your exam or if you need to reschedule, you may call the CT department at [phone] between the hours of 8:00 am and 5:00 pm, Monday-Friday.

## 2024-07-20 NOTE — Progress Notes (Signed)
 Agree with assessment and plan as outlined.

## 2024-07-27 ENCOUNTER — Other Ambulatory Visit

## 2024-07-27 ENCOUNTER — Ambulatory Visit (HOSPITAL_COMMUNITY)
Admission: RE | Admit: 2024-07-27 | Discharge: 2024-07-27 | Disposition: A | Discharge status: Home / Self Care | Source: Ambulatory Visit | Attending: Nurse Practitioner | Admitting: Nurse Practitioner

## 2024-07-27 ENCOUNTER — Encounter (HOSPITAL_COMMUNITY): Payer: Self-pay

## 2024-07-27 DIAGNOSIS — R10814 Left lower quadrant abdominal tenderness: Secondary | ICD-10-CM | POA: Diagnosis present

## 2024-07-27 DIAGNOSIS — R1012 Left upper quadrant pain: Secondary | ICD-10-CM | POA: Diagnosis present

## 2024-07-27 DIAGNOSIS — R0781 Pleurodynia: Secondary | ICD-10-CM | POA: Insufficient documentation

## 2024-07-27 DIAGNOSIS — R1010 Upper abdominal pain, unspecified: Secondary | ICD-10-CM | POA: Insufficient documentation

## 2024-07-27 DIAGNOSIS — R0602 Shortness of breath: Secondary | ICD-10-CM | POA: Diagnosis present

## 2024-07-27 MED ORDER — IOHEXOL 300 MG/ML  SOLN
100.0000 mL | Freq: Once | INTRAMUSCULAR | Status: AC | PRN
  Administered 2024-07-27: 100 mL via INTRAVENOUS

## 2024-08-02 ENCOUNTER — Ambulatory Visit: Payer: Self-pay | Admitting: Nurse Practitioner

## 2024-08-09 MED ORDER — OMEPRAZOLE 40 MG PO CPDR: 40.0000 mg | DELAYED_RELEASE_CAPSULE | Freq: Every day | ORAL | 1 refills | Status: AC

## 2024-08-09 NOTE — Telephone Encounter (Signed)
 Dr. Leigh, refer to office visit 07/20/2024. Chest/abd/pelvic CT findings do not explain his sx. Pls verify if you assess EGD is warranted or if you have any further recommendations and I will contact patient. THX.

## 2024-08-09 NOTE — Telephone Encounter (Signed)
 Rx sent as requested.
